# Patient Record
Sex: Male | Born: 1960 | Race: White | Hispanic: No | Marital: Married | Smoking: Current every day smoker
Health system: Southern US, Community
[De-identification: ages and names within clinical notes are randomized; demographics above are authoritative.]

## PROBLEM LIST (undated history)

## (undated) DIAGNOSIS — I10 Essential (primary) hypertension: Secondary | ICD-10-CM

## (undated) HISTORY — PX: TONSILLECTOMY: SUR1361

---

## 2006-04-12 ENCOUNTER — Emergency Department: Payer: Self-pay | Admitting: Emergency Medicine

## 2008-02-02 ENCOUNTER — Ambulatory Visit: Payer: Self-pay | Admitting: Internal Medicine

## 2008-03-24 ENCOUNTER — Ambulatory Visit: Payer: Self-pay | Admitting: Internal Medicine

## 2016-09-23 ENCOUNTER — Emergency Department: Payer: Self-pay

## 2016-09-23 ENCOUNTER — Encounter: Payer: Self-pay | Admitting: Emergency Medicine

## 2016-09-23 ENCOUNTER — Emergency Department
Admission: EM | Admit: 2016-09-23 | Discharge: 2016-09-24 | Disposition: A | Discharge status: Home / Self Care | Payer: Self-pay | Attending: Emergency Medicine | Admitting: Emergency Medicine

## 2016-09-23 DIAGNOSIS — M5440 Lumbago with sciatica, unspecified side: Secondary | ICD-10-CM

## 2016-09-23 DIAGNOSIS — F17210 Nicotine dependence, cigarettes, uncomplicated: Secondary | ICD-10-CM | POA: Insufficient documentation

## 2016-09-23 DIAGNOSIS — R569 Unspecified convulsions: Secondary | ICD-10-CM

## 2016-09-23 HISTORY — DX: Essential (primary) hypertension: I10

## 2016-09-23 LAB — CBC
HEMATOCRIT: 45.6 % (ref 40.0–52.0)
Hemoglobin: 15.8 g/dL (ref 13.0–18.0)
MCH: 31.9 pg (ref 26.0–34.0)
MCHC: 34.7 g/dL (ref 32.0–36.0)
MCV: 92 fL (ref 80.0–100.0)
Platelets: 217 10*3/uL (ref 150–440)
RBC: 4.96 MIL/uL (ref 4.40–5.90)
RDW: 14.3 % (ref 11.5–14.5)
WBC: 15.3 10*3/uL — ABNORMAL HIGH (ref 3.8–10.6)

## 2016-09-23 LAB — ETHANOL: Alcohol, Ethyl (B): <5 mg/dL (ref <5)

## 2016-09-23 LAB — SALICYLATE LEVEL

## 2016-09-23 LAB — BASIC METABOLIC PANEL
Anion gap: 8 (ref 5–15)
BUN: 19 mg/dL (ref 6–20)
CO2: 26 mmol/L (ref 22–32)
Calcium: 8.9 mg/dL (ref 8.9–10.3)
Chloride: 101 mmol/L (ref 101–111)
Creatinine, Ser: 1.37 mg/dL — ABNORMAL HIGH (ref 0.61–1.24)
GFR calc Af Amer: >60 mL/min (ref >60)
GFR, EST NON AFRICAN AMERICAN: 56 mL/min — AB (ref >60)
GLUCOSE: 144 mg/dL — AB (ref 65–99)
POTASSIUM: 3.8 mmol/L (ref 3.5–5.1)
Sodium: 135 mmol/L (ref 135–145)

## 2016-09-23 LAB — ACETAMINOPHEN LEVEL: Acetaminophen (Tylenol), Serum: <10 ug/mL — ABNORMAL LOW (ref 10–30)

## 2016-09-23 MED ORDER — DIAZEPAM 2 MG PO TABS
2.0000 mg | ORAL_TABLET | Freq: Once | ORAL | Status: AC
  Administered 2016-09-23: 2 mg via ORAL
  Filled 2016-09-23: qty 1

## 2016-09-23 MED ORDER — LIDOCAINE 5 % EX PTCH
2.0000 | MEDICATED_PATCH | CUTANEOUS | Status: DC
  Administered 2016-09-23: 2 via TRANSDERMAL
  Filled 2016-09-23: qty 2

## 2016-09-23 MED ORDER — SODIUM CHLORIDE 0.9 % IV BOLUS (SEPSIS)
1000.0000 mL | Freq: Once | INTRAVENOUS | Status: AC
  Administered 2016-09-23: 1000 mL via INTRAVENOUS

## 2016-09-23 NOTE — ED Triage Notes (Signed)
Pt presents to ED 09 via EMS from home with c/o witnessed seizure; per EMS, pt was confused post seizure, pt has no history of seizures; pt has bitten his tongue with a small laceration on the left underside of the tongue noted; bleeding is controlled at this time; at this time, pt is awake, alert and oriented x4 and answers all questions appropriately.

## 2016-09-23 NOTE — ED Notes (Signed)
Pt returned from CT °

## 2016-09-23 NOTE — ED Notes (Signed)
Pt went to CT

## 2016-09-24 ENCOUNTER — Emergency Department: Payer: Self-pay

## 2016-09-24 LAB — URINE DRUG SCREEN, QUALITATIVE (ARMC ONLY)
AMPHETAMINES, UR SCREEN: NOT DETECTED
Barbiturates, Ur Screen: NOT DETECTED
Benzodiazepine, Ur Scrn: NOT DETECTED
COCAINE METABOLITE, UR ~~LOC~~: NOT DETECTED
Cannabinoid 50 Ng, Ur ~~LOC~~: POSITIVE — AB
MDMA (ECSTASY) UR SCREEN: NOT DETECTED
Methadone Scn, Ur: NOT DETECTED
Opiate, Ur Screen: NOT DETECTED
PHENCYCLIDINE (PCP) UR S: NOT DETECTED
TRICYCLIC, UR SCREEN: NOT DETECTED

## 2016-09-24 LAB — URINALYSIS, COMPLETE (UACMP) WITH MICROSCOPIC
Bilirubin Urine: NEGATIVE
GLUCOSE, UA: NEGATIVE mg/dL
Hgb urine dipstick: NEGATIVE
Ketones, ur: NEGATIVE mg/dL
LEUKOCYTES UA: NEGATIVE
Nitrite: NEGATIVE
PROTEIN: NEGATIVE mg/dL
SPECIFIC GRAVITY, URINE: 1.017 (ref 1.005–1.030)
SQUAMOUS EPITHELIAL / LPF: NONE SEEN
pH: 6 (ref 5.0–8.0)

## 2016-09-24 MED ORDER — ETODOLAC 200 MG PO CAPS: 200.0000 mg | ORAL_CAPSULE | Freq: Three times a day (TID) | ORAL | 0 refills | Status: AC

## 2016-09-24 MED ORDER — DIAZEPAM 5 MG PO TABS: 5.0000 mg | ORAL_TABLET | Freq: Three times a day (TID) | ORAL | 0 refills | Status: AC | PRN

## 2016-09-24 MED ORDER — LIDOCAINE 5 % EX PTCH: 1.0000 | MEDICATED_PATCH | Freq: Two times a day (BID) | CUTANEOUS | 0 refills | Status: AC

## 2016-09-24 NOTE — ED Notes (Addendum)
Pt signed ED consent and placed on chart, signature pad is not responding at this time.

## 2016-09-24 NOTE — ED Provider Notes (Signed)
[facility] Emergency Department Provider Note   ____________________________________________   First MD Initiated Contact with Patient 09/23/16 2306     (approximate)  I have reviewed the triage vital signs and the nursing notes.   HISTORY  Chief Complaint Seizures    HPI Colton Lopez is a 56 y.o. male who comes into the hospital today with a seizure. The patient was sitting on the phone talking to his brother when the seizure started. The next thing that the patient remembers is being disoriented and strange people asking his questions. The patient's family state that his seizure seemed to last 2-3 minutes. The patient was found on the chair "flopping around like a fish." The patient states that he has had recent stress in the last 6 weeks as his wife has been diagnosed with cancer and they have not been sleeping well. The patient never fell off of the couch or hit his head. The patient never stopped breathing and never turned blue. The patient was saying incoherent things and then started snoring. The patient denies prior chest pain, headache, nausea or vomiting. The patient has been eating and drinking well. He has never had seizures before. He reports that since the seizure his low back hurts. He rates the pain a 1/10 when he lays on his side but it is worse to lay flat.    Past Medical History:  Diagnosis Date  . Hypertension     There are no active problems to display for this patient.   Past Surgical History:  Procedure Laterality Date  . TONSILLECTOMY      Prior to Admission medications   Medication Sig Start Date End Date Taking? Authorizing Provider  diazepam (VALIUM) 5 MG tablet Take 1 tablet (5 mg total) by mouth every 8 (eight) hours as needed for muscle spasms. 09/24/16 09/24/17  Rebecka Apley, MD  etodolac (LODINE) 200 MG capsule Take 1 capsule (200 mg total) by mouth every 8 (eight) hours. 09/24/16   Rebecka Apley, MD    lidocaine (LIDODERM) 5 % Place 1 patch onto the skin every 12 (twelve) hours. Remove & Discard patch within 12 hours or as directed by MD 09/24/16 09/24/17  Rebecka Apley, MD    Allergies Patient has no allergy information on record.  No family history on file.  Social History Social History  Substance Use Topics  . Smoking status: Current Every Day Smoker    Packs/day: 1.00    Types: Cigarettes  . Smokeless tobacco: Never Used  . Alcohol use Yes    Review of Systems  Constitutional: No fever/chills Eyes: No visual changes. ENT: No sore throat. Cardiovascular: Denies chest pain. Respiratory: Denies shortness of breath. Gastrointestinal: No abdominal pain.  No nausea, no vomiting.  No diarrhea.  No constipation. Genitourinary: Negative for dysuria. Musculoskeletal: back pain. Skin: Negative for rash. Neurological: seizure   ____________________________________________   PHYSICAL EXAM:  VITAL SIGNS: ED Triage Vitals  Enc Vitals Group     BP 09/23/16 2226 121/74     Pulse Rate 09/23/16 2226 96     Resp 09/23/16 2226 (!) 25     Temp 09/23/16 2226 98.2 F (36.8 C)     Temp Source 09/23/16 2226 Oral     SpO2 09/23/16 2223 94 %     Weight 09/23/16 2234 190 lb (86.2 kg)     Height 09/23/16 2234 5\' 10"  (1.778 m)     Head Circumference --  Peak Flow --      Pain Score --      Pain Loc --      Pain Edu? --      Excl. in GC? --     Constitutional: Alert and oriented. Well appearing and in no acute distress. Eyes: Conjunctivae are normal. PERRL. EOMI. Head: Atraumatic. Nose: No congestion/rhinnorhea. Mouth/Throat: Mucous membranes are moist.  Oropharynx non-erythematous. Small non bleeding laceration noticed underleft tongue. Does not go through to the other side Cardiovascular: Normal rate, regular rhythm. Grossly normal heart sounds.  Good peripheral circulation. Respiratory: Normal respiratory effort.  No retractions. Lungs CTAB. Gastrointestinal: Soft and  nontender. No distention. Positive bowels sounds Musculoskeletal: mild tenderness to palpation of the midline lumbar spine. Positive straight leg raise Neurologic:  Normal speech and language. Cranial nerves II - XII are grossly intact with no focal motor or neuro deficit. The patient has no pronator drift and has no ataxia with finger to nose. Strength is 5/5 in upper and lower extremities.  Skin:  Skin is warm, dry and intact. No rash noted. Psychiatric: Mood and affect are normal.   ____________________________________________   LABS (all labs ordered are listed, but only abnormal results are displayed)  Labs Reviewed  BASIC METABOLIC PANEL - Abnormal; Notable for the following:       Result Value   Glucose, Bld 144 (*)    Creatinine, Ser 1.37 (*)    GFR calc non Af Amer 56 (*)    All other components within normal limits  CBC - Abnormal; Notable for the following:    WBC 15.3 (*)    All other components within normal limits  ACETAMINOPHEN LEVEL - Abnormal; Notable for the following:    Acetaminophen (Tylenol), Serum <10 (*)    All other components within normal limits  URINALYSIS, COMPLETE (UACMP) WITH MICROSCOPIC - Abnormal; Notable for the following:    Color, Urine YELLOW (*)    APPearance CLEAR (*)    Bacteria, UA FEW (*)    All other components within normal limits  URINE DRUG SCREEN, QUALITATIVE ([facility] ONLY) - Abnormal; Notable for the following:    Cannabinoid 50 Ng, Ur [place] POSITIVE (*)    All other components within normal limits  SALICYLATE LEVEL  ETHANOL  CBG MONITORING, ED   ____________________________________________  EKG  ED ECG REPORT I, Rebecka ApleyWebster,  Allison P, the attending physician, personally viewed and interpreted this ECG.   Date: 09/24/2016  EKG Time: 0059  Rate: 88  Rhythm: normal sinus rhythm  Axis: normal  Intervals:none  ST&T Change: none  ____________________________________________  RADIOLOGY  Dg Lumbar Spine 2-3 Views  Result Date:  09/24/2016 CLINICAL DATA:  Low back pain after seizure tonight. EXAM: LUMBAR SPINE - 2-3 VIEW COMPARISON:  None. FINDINGS: Slight left convex curvature. The lumbar vertebrae are normal in height except for mild chronic appearing biconcave deformity at L4. No evidence of acute fracture. No bone lesion or bony destruction. Moderate L5-S1 degenerative disc and facet changes. Sacroiliac joints are unremarkable. IMPRESSION: Curvature and mild lumbosacral degenerative changes. No acute findings. Electronically Signed   By: Ellery Plunkaniel R Mitchell M.D.   On: 09/24/2016 01:31   Ct Head Wo Contrast  Result Date: 09/23/2016 CLINICAL DATA:  Unwitnessed seizure.  Postictal confusion. EXAM: CT HEAD WITHOUT CONTRAST TECHNIQUE: Contiguous axial images were obtained from the base of the skull through the vertex without intravenous contrast. COMPARISON:  None. FINDINGS: Brain: There is no intracranial hemorrhage, mass or evidence of acute infarction. There is  no extra-axial fluid collection. Gray matter and white matter appear normal. Cerebral volume is normal for age. Brainstem and posterior fossa are unremarkable. The CSF spaces appear normal. Vascular: No hyperdense vessel or unexpected calcification. Skull: Normal. Negative for fracture or focal lesion. Sinuses/Orbits: No acute finding. Other: None. IMPRESSION: Normal brain Electronically Signed   By: Ellery Plunk M.D.   On: 09/23/2016 23:27    ____________________________________________   PROCEDURES  Procedure(s) performed: None  Procedures  Critical Care performed: No  ____________________________________________   INITIAL IMPRESSION / ASSESSMENT AND PLAN / ED COURSE  Pertinent labs & imaging results that were available during my care of the patient were reviewed by me and considered in my medical decision making (see chart for details).  This is a 56 year old male who comes into the hospital today with a seizure. The patient sounds like he had a post  ictal phase but is currently at his neurologic baseline. The patient does have some back pain which may have been injured during the seizure. I will give him a dose of valium and a lidoderm patch. The patient does have some marijuana in his urine. I will also send the patient for a lumbar spine xray. He will be reassessed.      I did reassess the patient. At this time he is doing well. He states that his back pain is improved but is worse when he stands or sits up. I feel that the patient may have injured his back during the seizure. I discussed with the patient that one seizure does not make a diagnosis of epilepsy but his increased stress, lack of sleep and marijuana use may be the cause of his seizure. He should follow up with his primary care physician as well as neurology. ____________________________________________   FINAL CLINICAL IMPRESSION(S) / ED DIAGNOSES  Final diagnoses:  Seizure (HCC)  Acute midline low back pain with sciatica, sciatica laterality unspecified      NEW MEDICATIONS STARTED DURING THIS VISIT:  New Prescriptions   DIAZEPAM (VALIUM) 5 MG TABLET    Take 1 tablet (5 mg total) by mouth every 8 (eight) hours as needed for muscle spasms.   ETODOLAC (LODINE) 200 MG CAPSULE    Take 1 capsule (200 mg total) by mouth every 8 (eight) hours.   LIDOCAINE (LIDODERM) 5 %    Place 1 patch onto the skin every 12 (twelve) hours. Remove & Discard patch within 12 hours or as directed by MD     Note:  This document was prepared using Dragon voice recognition software and may include unintentional dictation errors.    Rebecka Apley, MD 09/24/16 [phone]

## 2016-09-24 NOTE — ED Notes (Addendum)
Patient discharge and follow up information reviewed with patient by ED nursing staff and patient given the opportunity to ask questions pertaining to ED visit and discharge plan of care. Patient advised that should symptoms not continue to improve, resolve entirely, or should new symptoms develop then a follow up visit with their PCP or a return visit to the ED may be warranted. Patient verbalized consent and understanding of discharge plan of care including potential need for further evaluation. Patient being discharged in stable condition per attending ED physician on duty.   Notified pt and family to follow up Neurology before returning to driving.

## 2016-09-24 NOTE — Discharge Instructions (Signed)
Please follow-up with your primary care physician as well as neurology. °

## 2016-09-24 NOTE — ED Notes (Signed)
Patient transported to X-ray 

## 2017-05-21 DEATH — deceased

## 2019-04-03 IMAGING — CT CT HEAD W/O CM
4 series · 17 of 47 positions shown, 19 images · non-contrast
Comparison: None.

CLINICAL DATA: Unwitnessed seizure.  Postictal confusion.

EXAM:
CT HEAD WITHOUT CONTRAST
TECHNIQUE: Contiguous axial images were obtained from the base of the skull
through the vertex without intravenous contrast.

[Series 2: head bone · axial · 0.42mm/px · z∈[-87,-37]mm · 4 of 74 slices shown]
[im 8/74  bone]
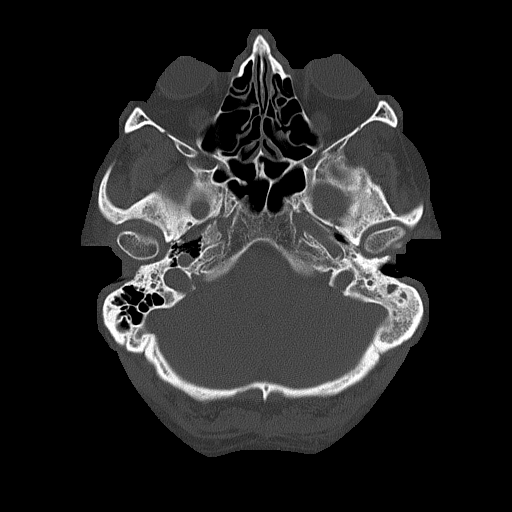
[im 15/74  bone]
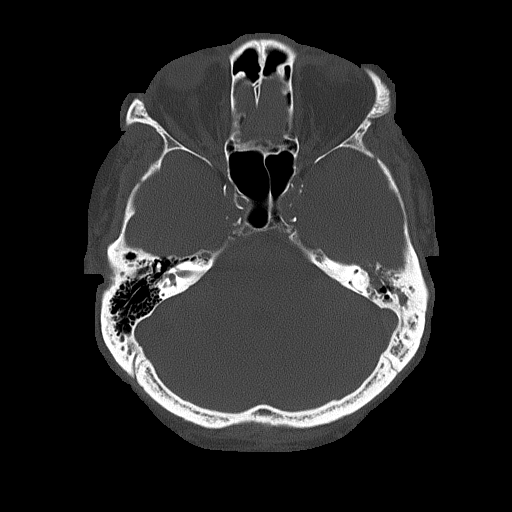
[im 22/74  bone]
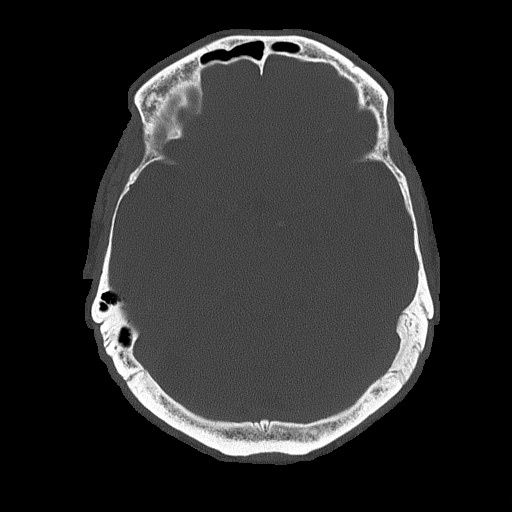
[im 33/74  bone]
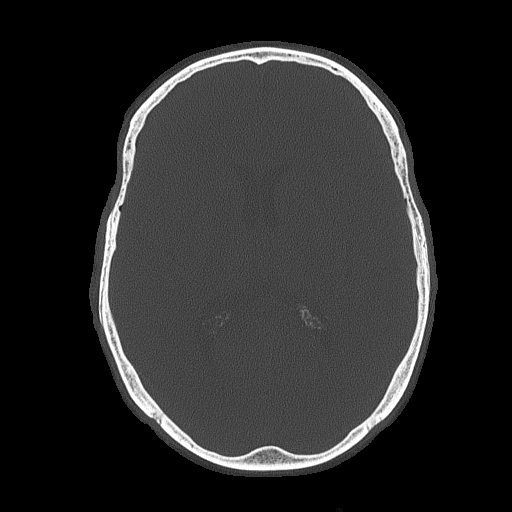

[Series 3: head wo · axial · 0.42mm/px · z∈[-86,+24]mm · 7 of 30 slices shown, 9 images]
[im 4/30  brain]
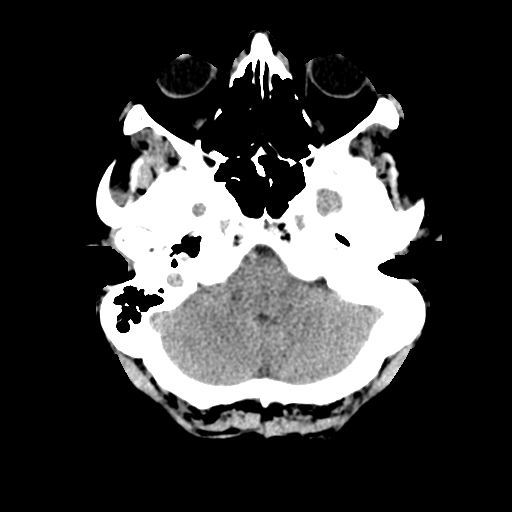
[im 4/30  bone]
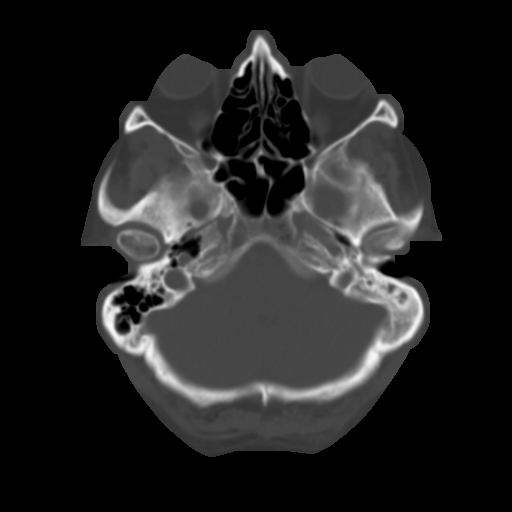
[im 8/30  brain]
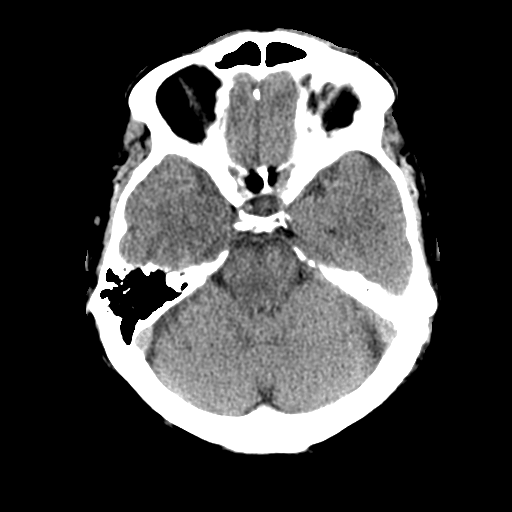
[im 11/30  brain]
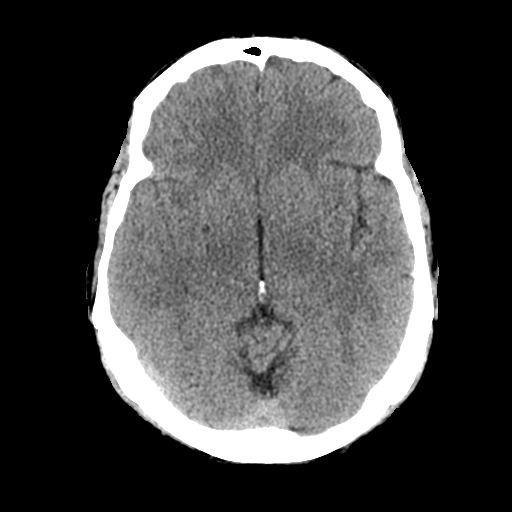
[im 15/30  brain]
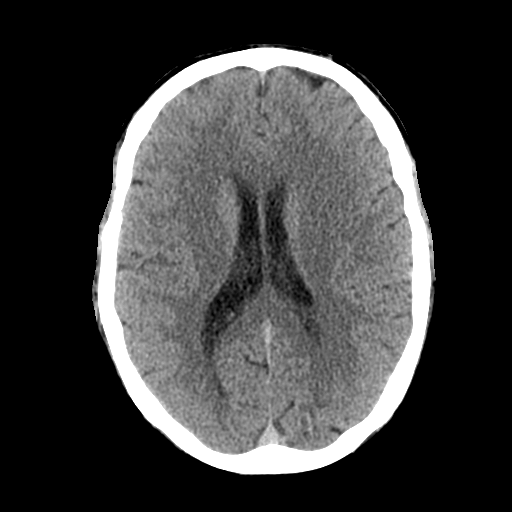
[im 19/30  brain]
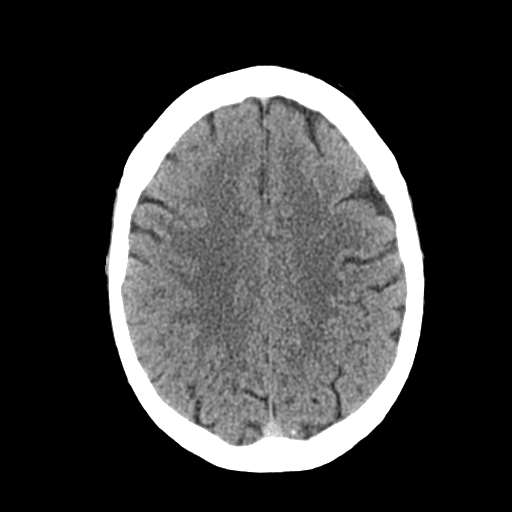
[im 19/30  bone]
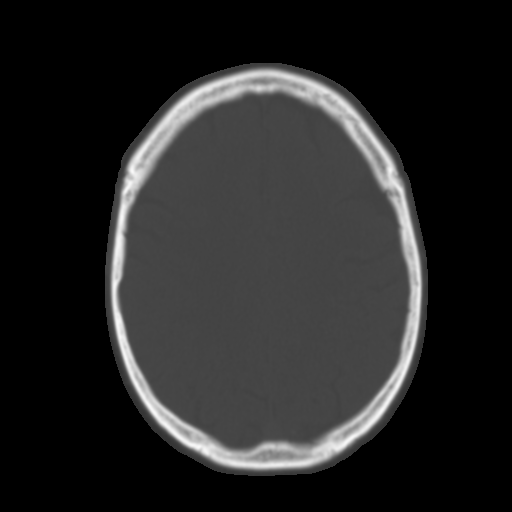
[im 22/30  brain]
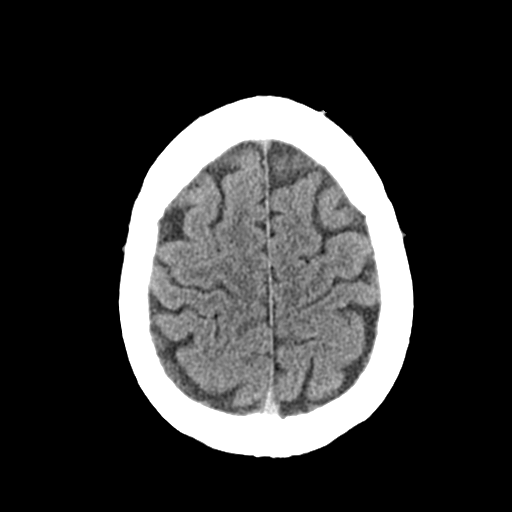
[im 26/30  brain]
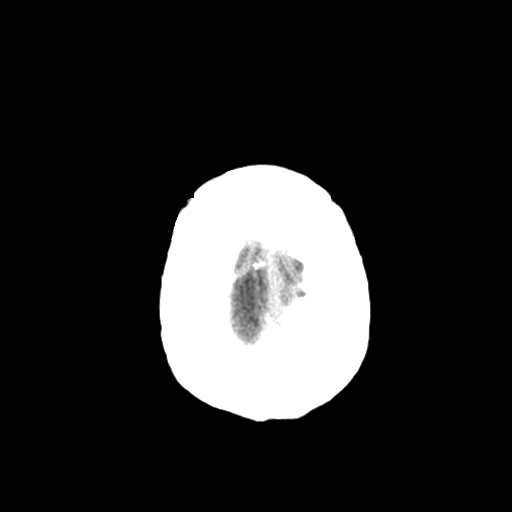

[Series 4: coronal soft tissue · coronal · 0.29mm/px · 3 of 64 slices shown]
[im 22/64  brain]
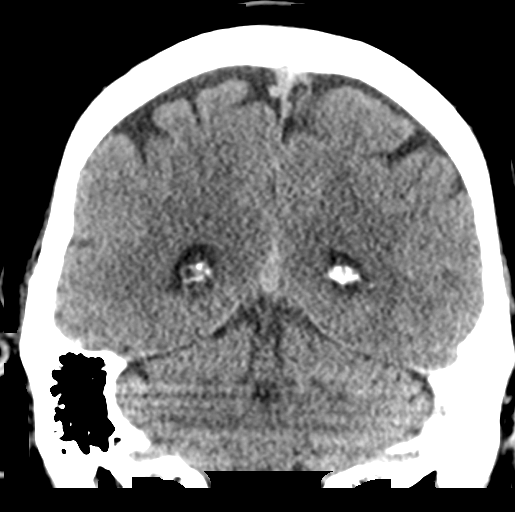
[im 29/64  brain]
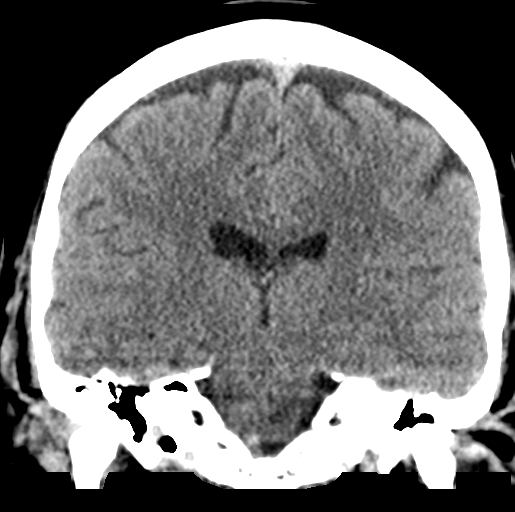
[im 36/64  brain]
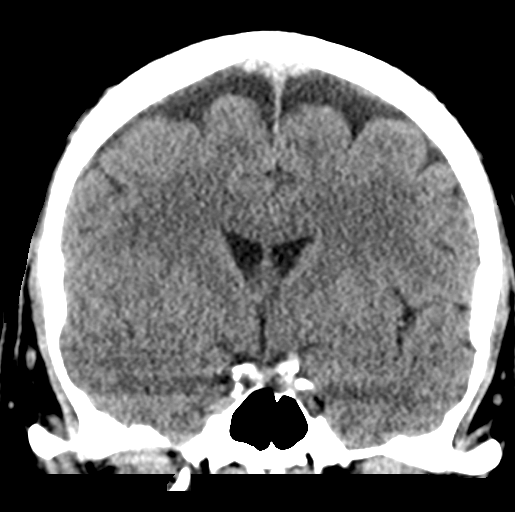

[Series 5: sagittal soft tissue · sagittal · 0.29mm/px · 3 of 51 slices shown]
[im 17/51  brain]
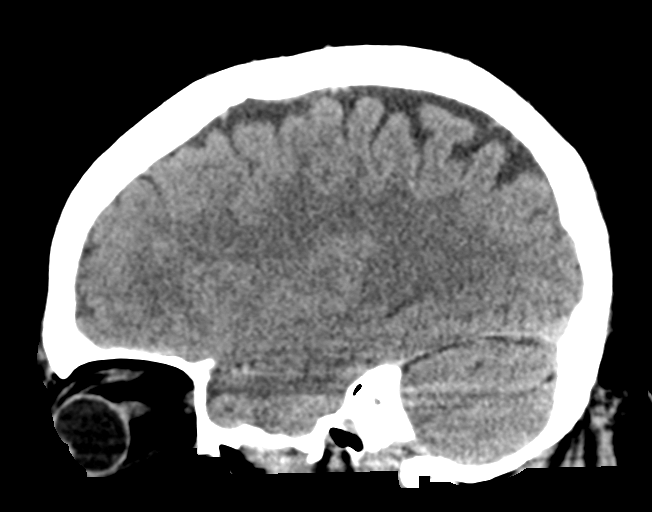
[im 26/51  brain]
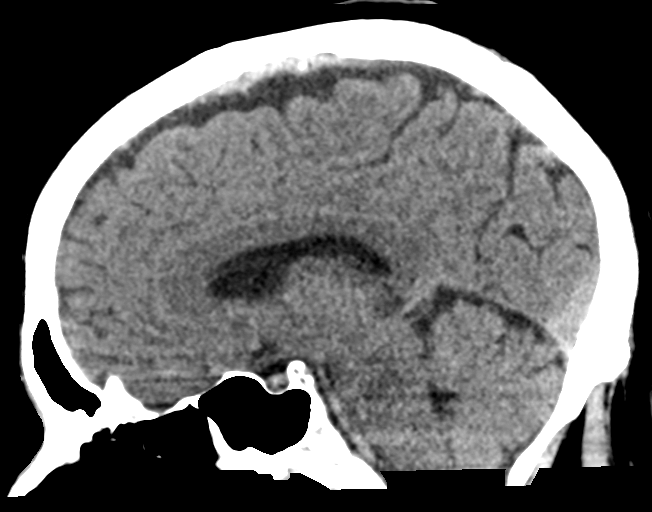
[im 34/51  brain]
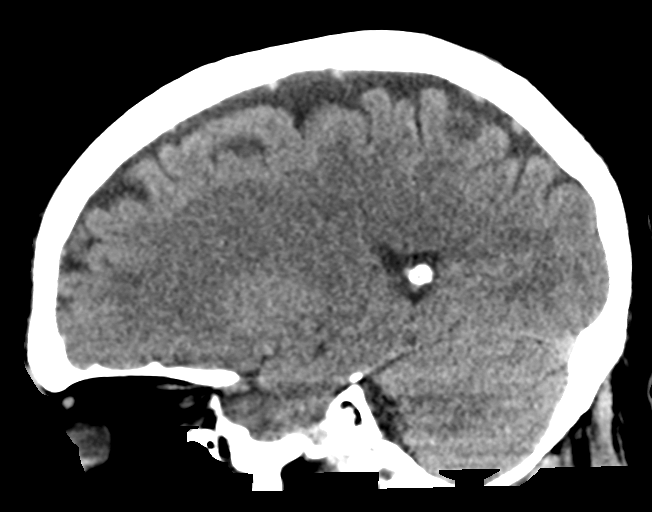

[17 of 47 positions shown; findings below may reference images not displayed]

FINDINGS: Brain: There is no intracranial hemorrhage, mass or evidence of
acute infarction. There is no extra-axial fluid collection. Gray
matter and white matter appear normal. Cerebral volume is normal for
age. Brainstem and posterior fossa are unremarkable. The CSF spaces
appear normal.

Vascular: No hyperdense vessel or unexpected calcification.

Skull: Normal. Negative for fracture or focal lesion.

Sinuses/Orbits: No acute finding.

Other: None.
IMPRESSION: Normal brain

## 2019-04-04 IMAGING — CR DG LUMBAR SPINE 2-3V
1 series · 3 of 3 positions shown · non-contrast
Comparison: None.

CLINICAL DATA: Low back pain after seizure tonight.

EXAM:
LUMBAR SPINE - 2-3 VIEW

[Series 1: dg lumbar spine 2-3 views · 0.14mm/px · 3 of 3 slices shown]
[im 1/3]
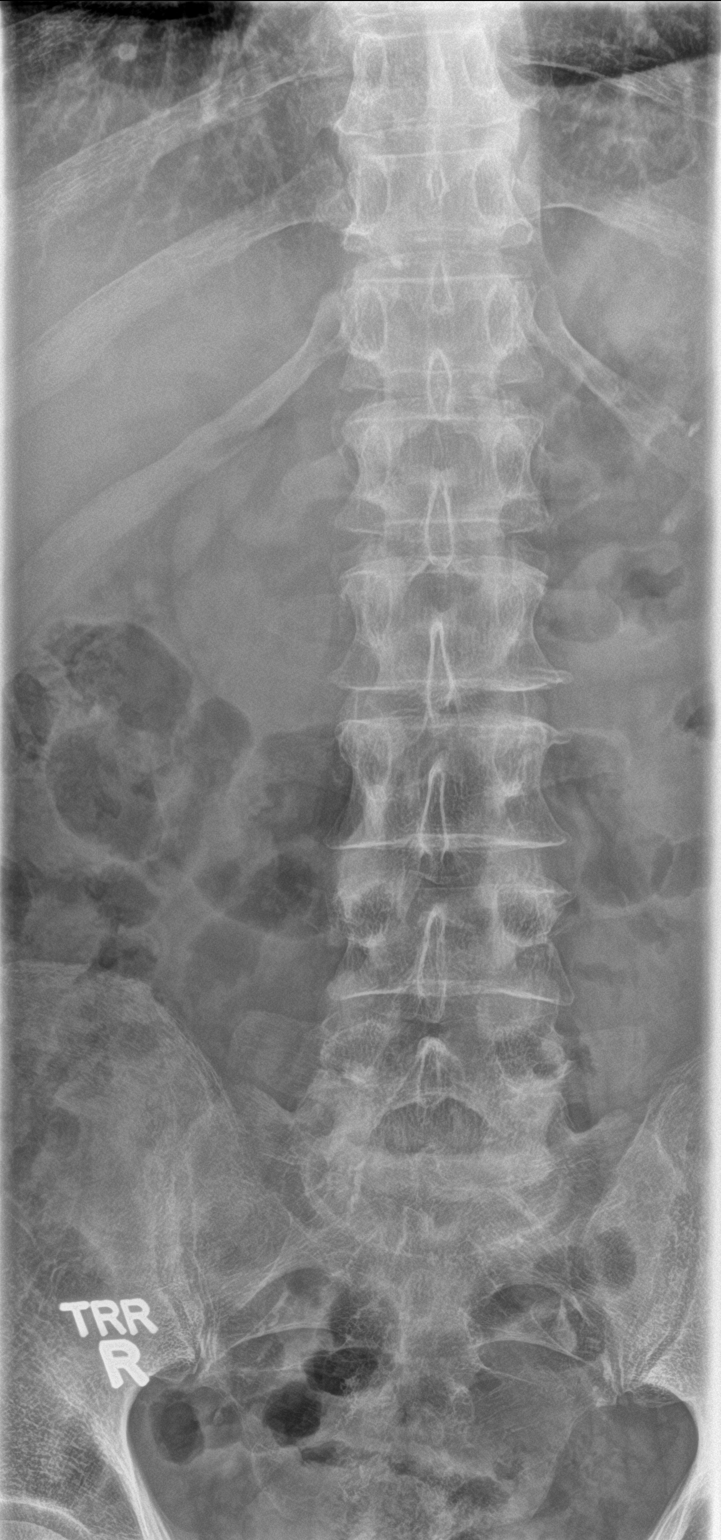
[im 2/3]
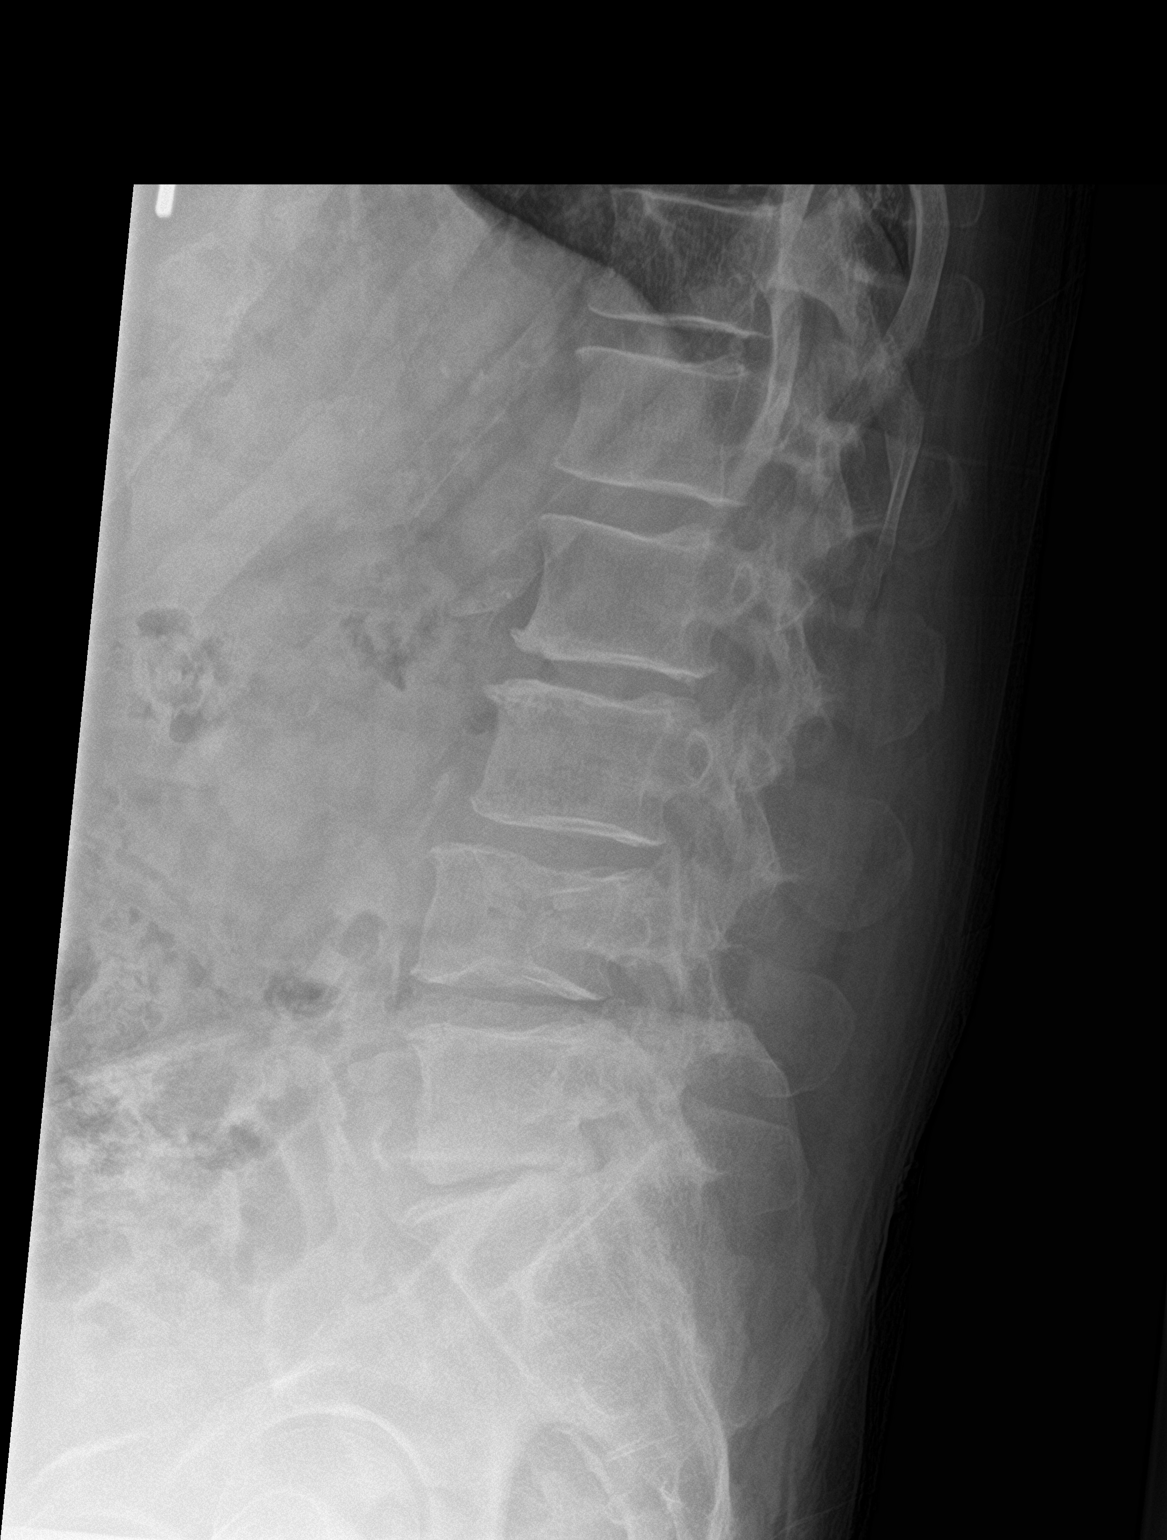
[im 3/3]
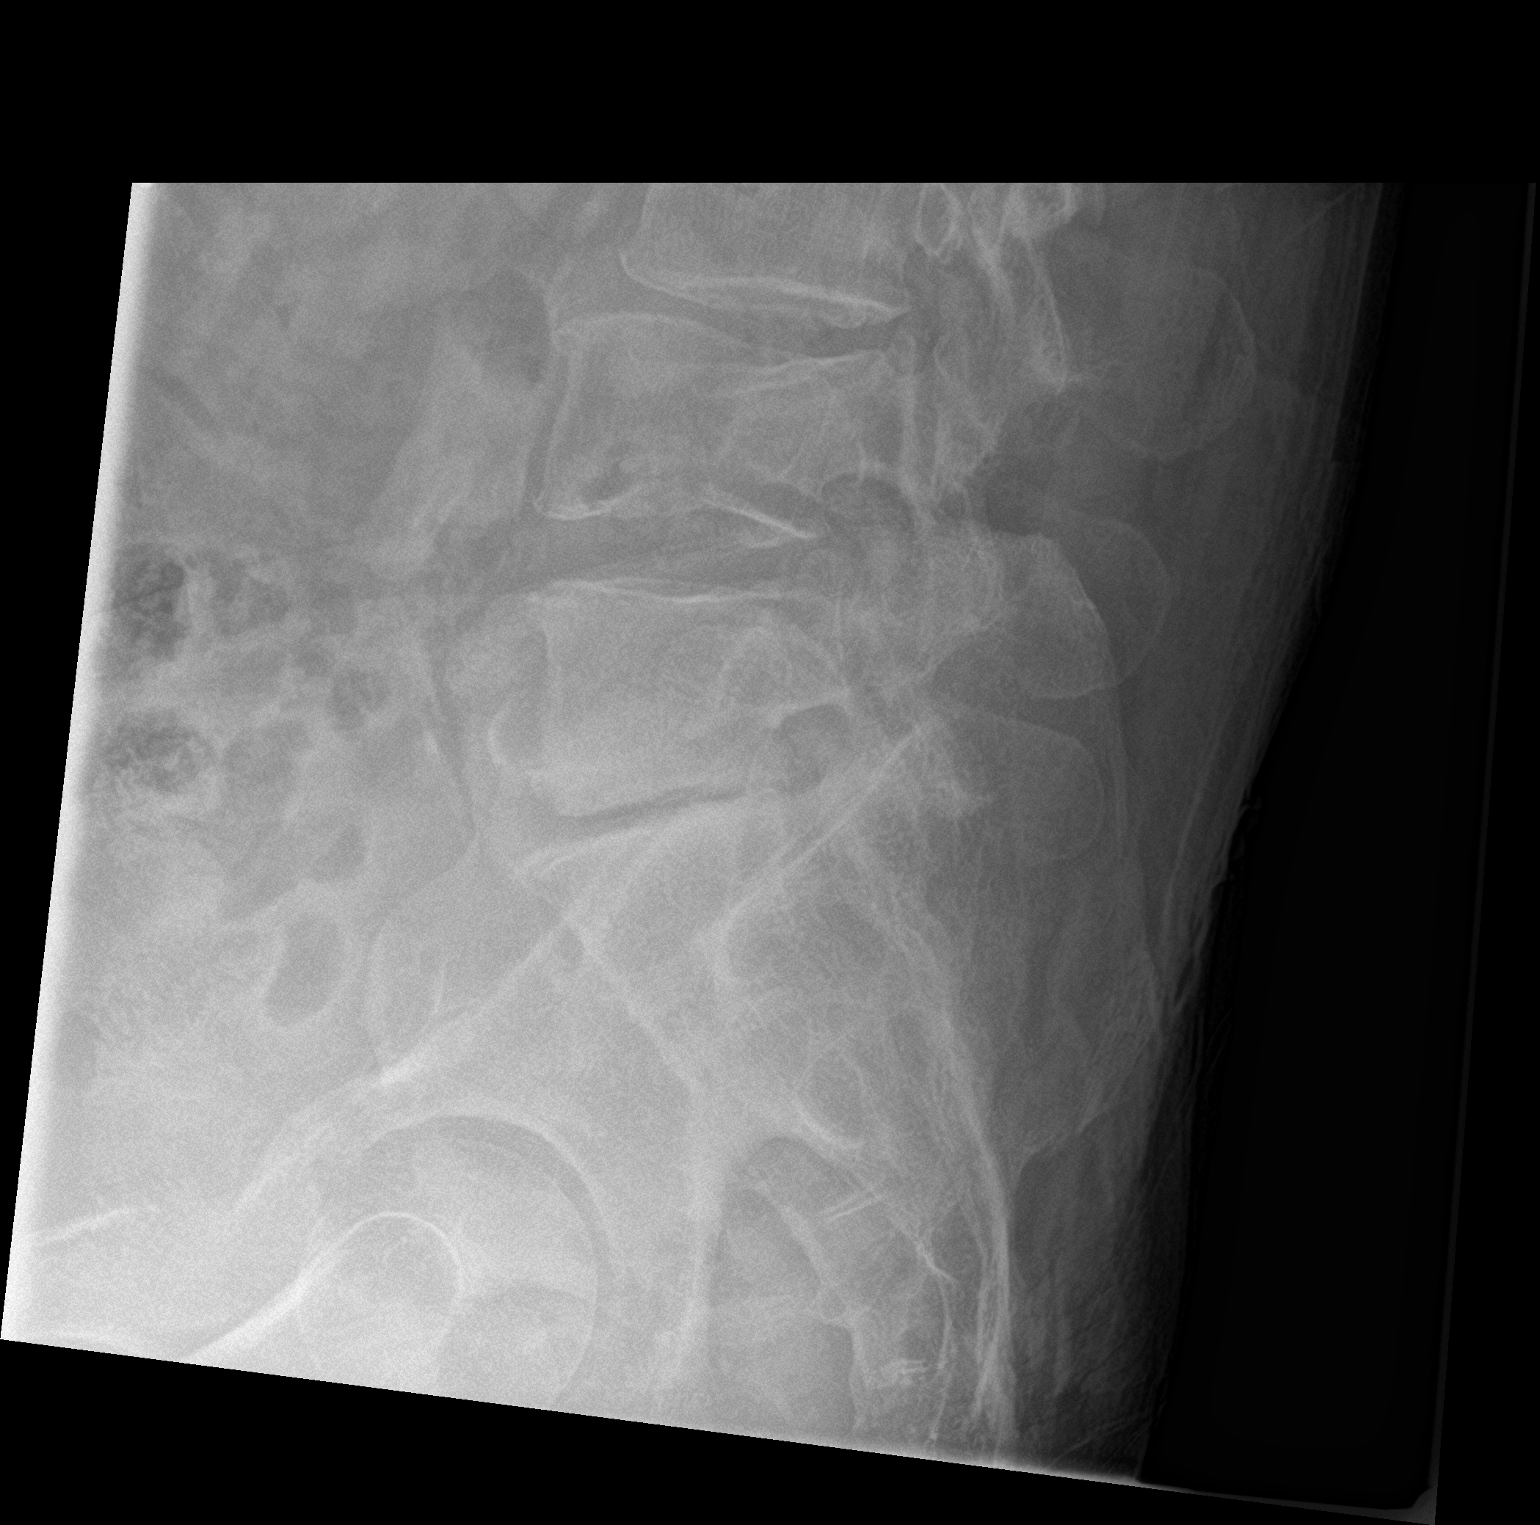

[3 of 3 positions shown; findings below may reference images not displayed]

FINDINGS: Slight left convex curvature. The lumbar vertebrae are normal in
height except for mild chronic appearing biconcave deformity at L4.
No evidence of acute fracture. No bone lesion or bony destruction.
Moderate L5-S1 degenerative disc and facet changes. Sacroiliac
joints are unremarkable.
IMPRESSION: Curvature and mild lumbosacral degenerative changes. No acute
findings.
